# Patient Record
Sex: Female | Born: 1970 | Race: Black or African American | Hispanic: No | Marital: Married | Smoking: Never smoker
Health system: Southern US, Community
[De-identification: ages and names within clinical notes are randomized; demographics above are authoritative.]

## PROBLEM LIST (undated history)

## (undated) DIAGNOSIS — I10 Essential (primary) hypertension: Secondary | ICD-10-CM

## (undated) HISTORY — PX: TUBAL LIGATION: SHX77

---

## 2002-08-01 ENCOUNTER — Emergency Department (HOSPITAL_COMMUNITY): Admission: EM | Admit: 2002-08-01 | Discharge: 2002-08-02 | Payer: Self-pay | Admitting: Emergency Medicine

## 2002-08-05 ENCOUNTER — Ambulatory Visit (HOSPITAL_COMMUNITY): Admission: RE | Admit: 2002-08-05 | Discharge: 2002-08-05 | Payer: Self-pay | Admitting: Family Medicine

## 2003-01-22 ENCOUNTER — Emergency Department (HOSPITAL_COMMUNITY): Admission: EM | Admit: 2003-01-22 | Discharge: 2003-01-22 | Payer: Self-pay | Admitting: Emergency Medicine

## 2003-01-22 ENCOUNTER — Encounter: Payer: Self-pay | Admitting: Emergency Medicine

## 2003-02-07 ENCOUNTER — Emergency Department (HOSPITAL_COMMUNITY): Admission: EM | Admit: 2003-02-07 | Discharge: 2003-02-08 | Payer: Self-pay | Admitting: Emergency Medicine

## 2004-06-25 ENCOUNTER — Other Ambulatory Visit: Admission: RE | Admit: 2004-06-25 | Discharge: 2004-06-25 | Payer: Self-pay | Admitting: Family Medicine

## 2005-03-25 ENCOUNTER — Emergency Department (HOSPITAL_COMMUNITY): Admission: EM | Admit: 2005-03-25 | Discharge: 2005-03-25 | Payer: Self-pay | Admitting: *Deleted

## 2005-04-25 ENCOUNTER — Ambulatory Visit (HOSPITAL_COMMUNITY): Admission: RE | Admit: 2005-04-25 | Discharge: 2005-04-25 | Payer: Self-pay | Admitting: Family Medicine

## 2006-10-05 IMAGING — US US PELVIS COMPLETE MODIFY
1 series · 18 of 25 positions shown · non-contrast
Comparison: none

CLINICAL DATA: Pelvic pain.  Pelvic inflammatory disease.  IUD.  Evaluate for tuboovarian abscess.
TRANSABDOMINAL AND TRANSVAGINAL PELVIC ULTRASOUND:
TECHNIQUE: Both transabdominal and transvaginal ultrasound examinations of the pelvis were performed including evaluation of the uterus, ovaries, adnexal regions, and pelvic cul-de-sac.
The uterus is mildly enlarged measuring approximately 12.5 cm in length x 5.4 cm in AP diameter x 6.1 cm in transverse diameter on transabdominal sonography.  The uterus is normal in contour.  No adnexal masses or abnormal fluid collections are identified.
Transvaginal sonography shows an IUD in appropriate position within the endometrial cavity of the uterus.  No fluid is seen within the endometrial cavity.  The myometrium has heterogeneous echotexture and several small cystic areas are seen within the junctional zone of the myometrium, and this is suspicious for adenomyosis.  No fibroids are identified.  
Both ovaries are directly visualized on transvaginal sonography and are normal in appearance.  Tiny less than 1 cm follicles are seen in both.  There is a tiny amount of free fluid seen in the right adnexa which is within a physiologic range.

[Series 1: us transvaginal non-ob · 18 of 116 slices shown]
[im 1/116]
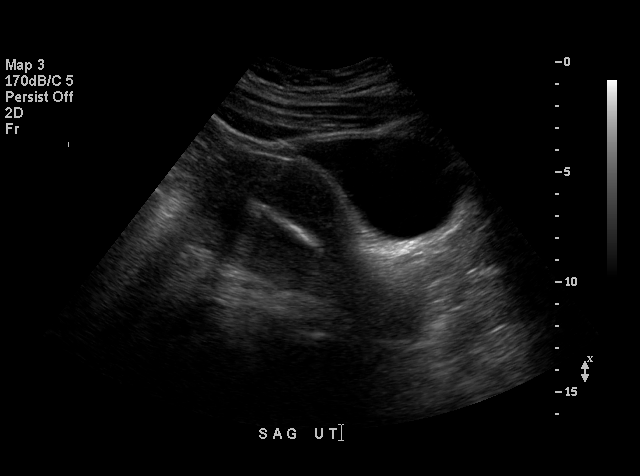
[im 10/116]
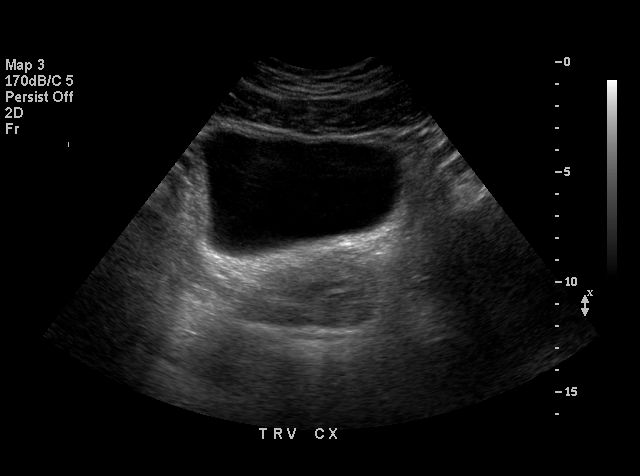
[im 15/116]
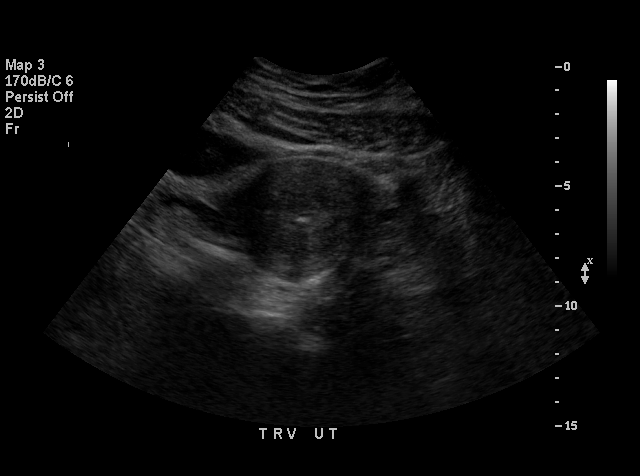
[im 20/116]
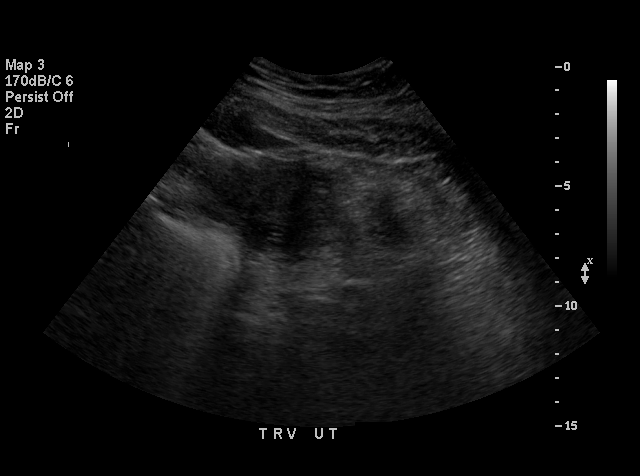
[im 29/116]
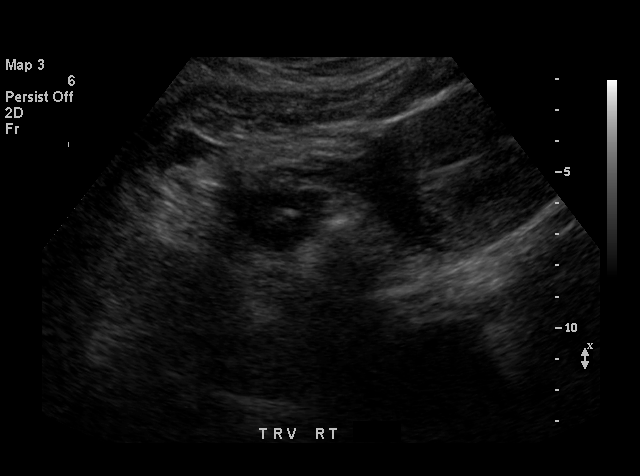
[im 34/116]
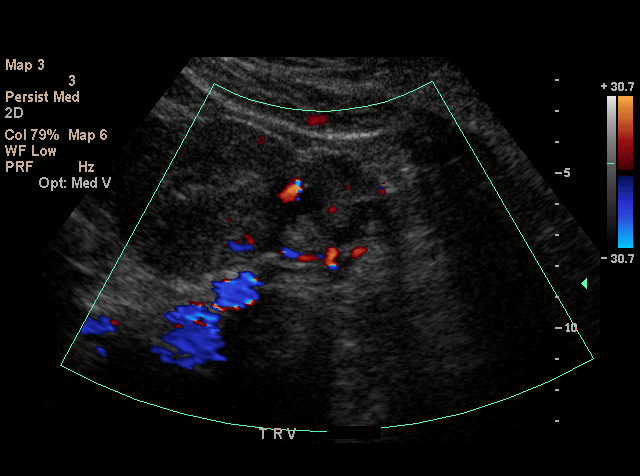
[im 44/116]
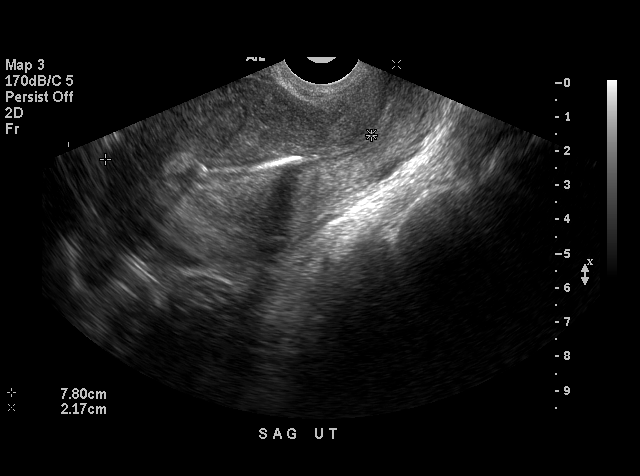
[im 48/116]
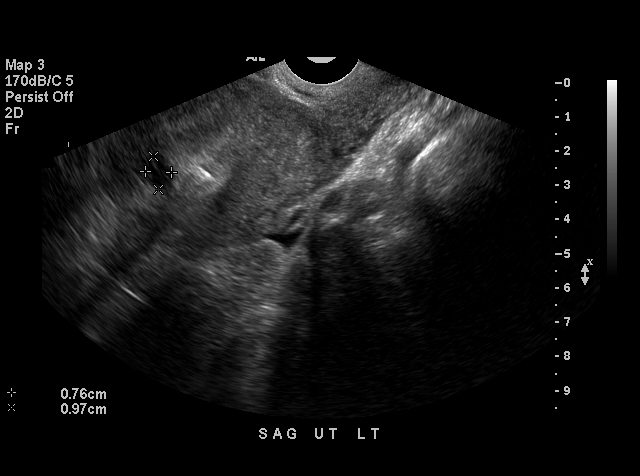
[im 53/116]
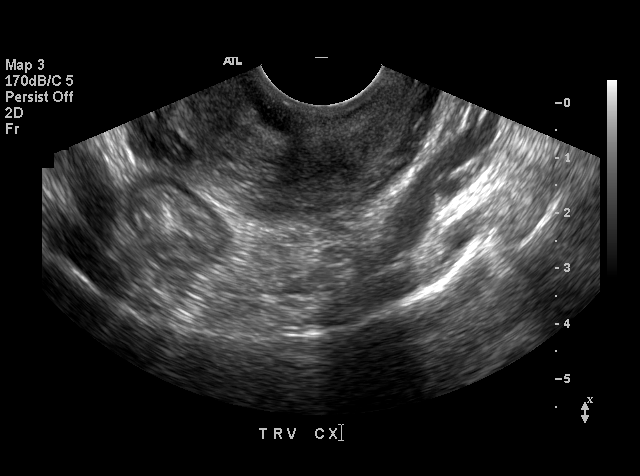
[im 63/116]
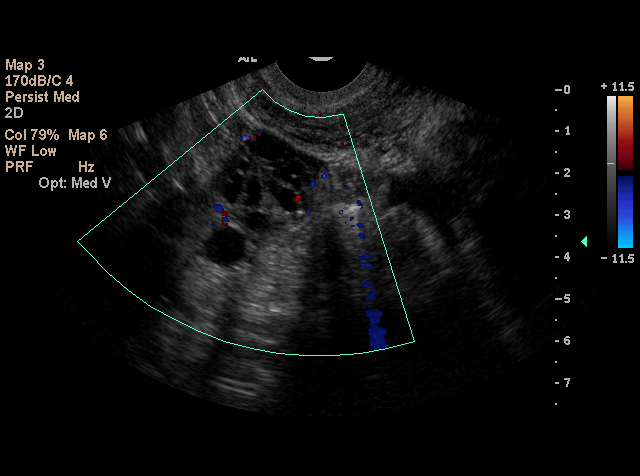
[im 68/116]
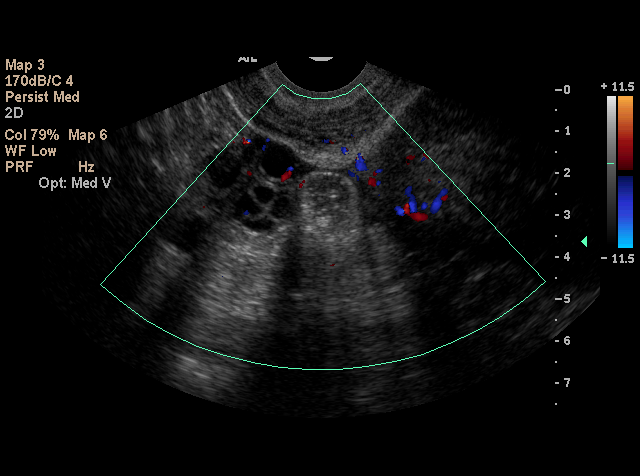
[im 72/116]
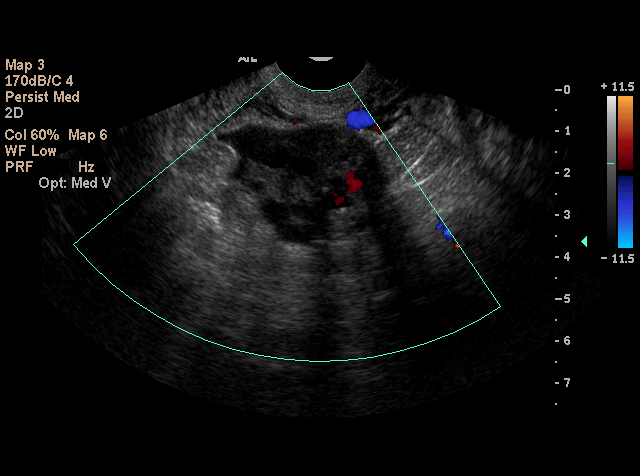
[im 82/116]
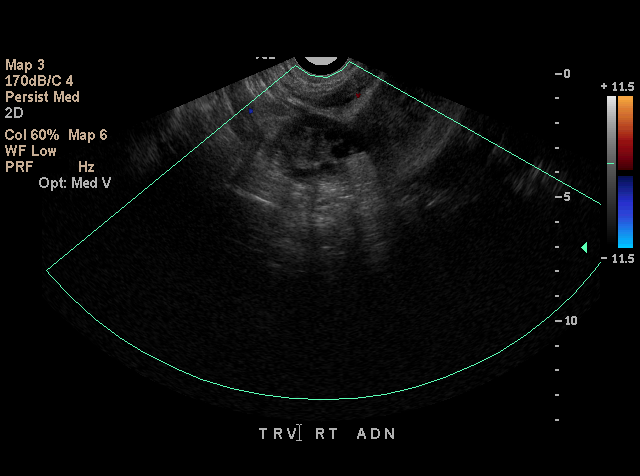
[im 87/116]
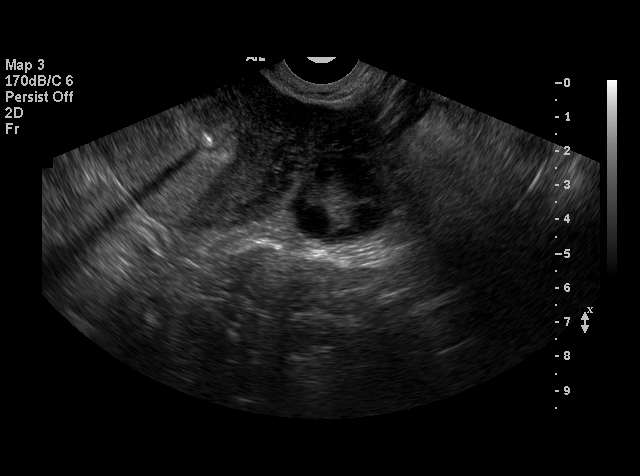
[im 96/116]
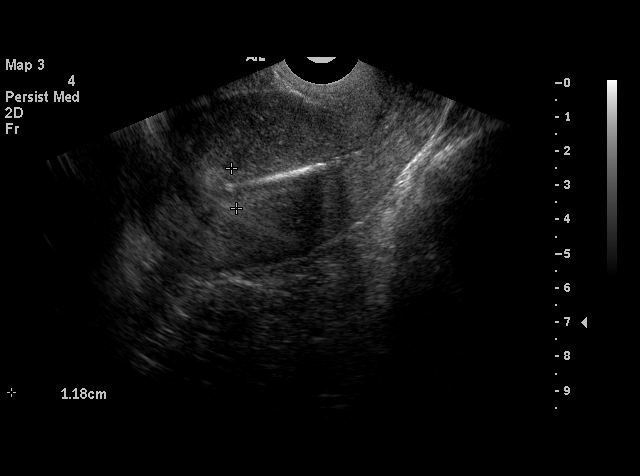
[im 101/116]
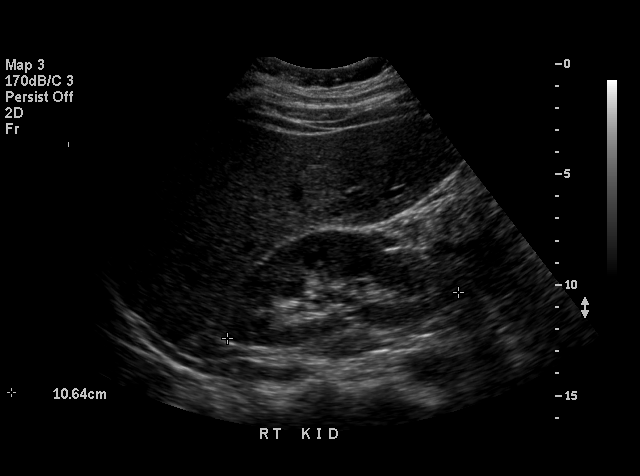
[im 106/116]
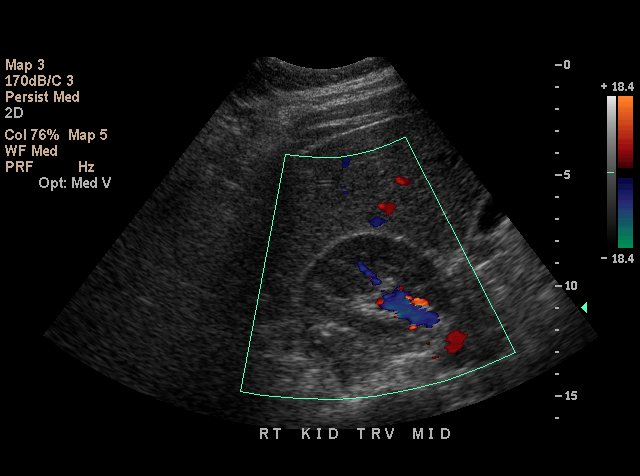
[im 116/116]
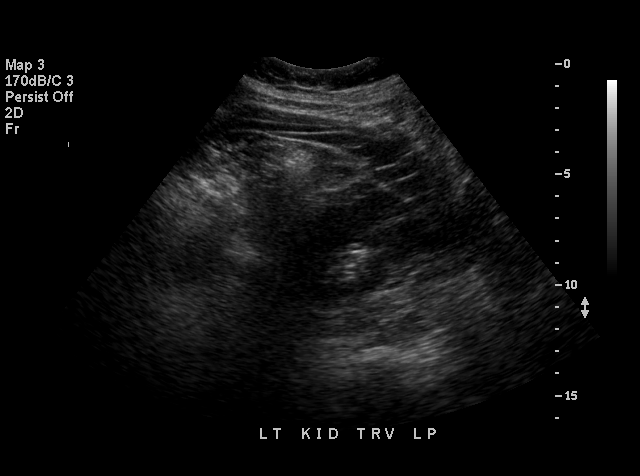

[18 of 25 positions shown; findings below may reference images not displayed]

IMPRESSION: 1.  Mildly enlarged uterus with tiny cystic areas in the junctional zone of the myometrium, suspicious for adenomyosis.  If clinically warranted, pelvis MRI could be performed for further evaluation.
2.  IUD within appropriate position within the endometrial cavity.
3.  Normal appearance of both ovaries.  No evidence of tuboovarian abscess or other adnexal mass.

## 2011-06-04 ENCOUNTER — Other Ambulatory Visit: Payer: Self-pay | Admitting: Occupational Medicine

## 2011-06-04 ENCOUNTER — Ambulatory Visit
Admission: RE | Admit: 2011-06-04 | Discharge: 2011-06-04 | Disposition: A | Discharge status: Home / Self Care | Payer: Self-pay | Source: Ambulatory Visit | Attending: Occupational Medicine | Admitting: Occupational Medicine

## 2011-06-04 DIAGNOSIS — Z021 Encounter for pre-employment examination: Secondary | ICD-10-CM

## 2012-11-13 IMAGING — CR DG CHEST 1V
1 series · 1 of 1 positions shown · non-contrast
Comparison: None.

CLINICAL DATA: Pre-employment, history of positive PPD

CHEST - 1 VIEW

[view not recorded]
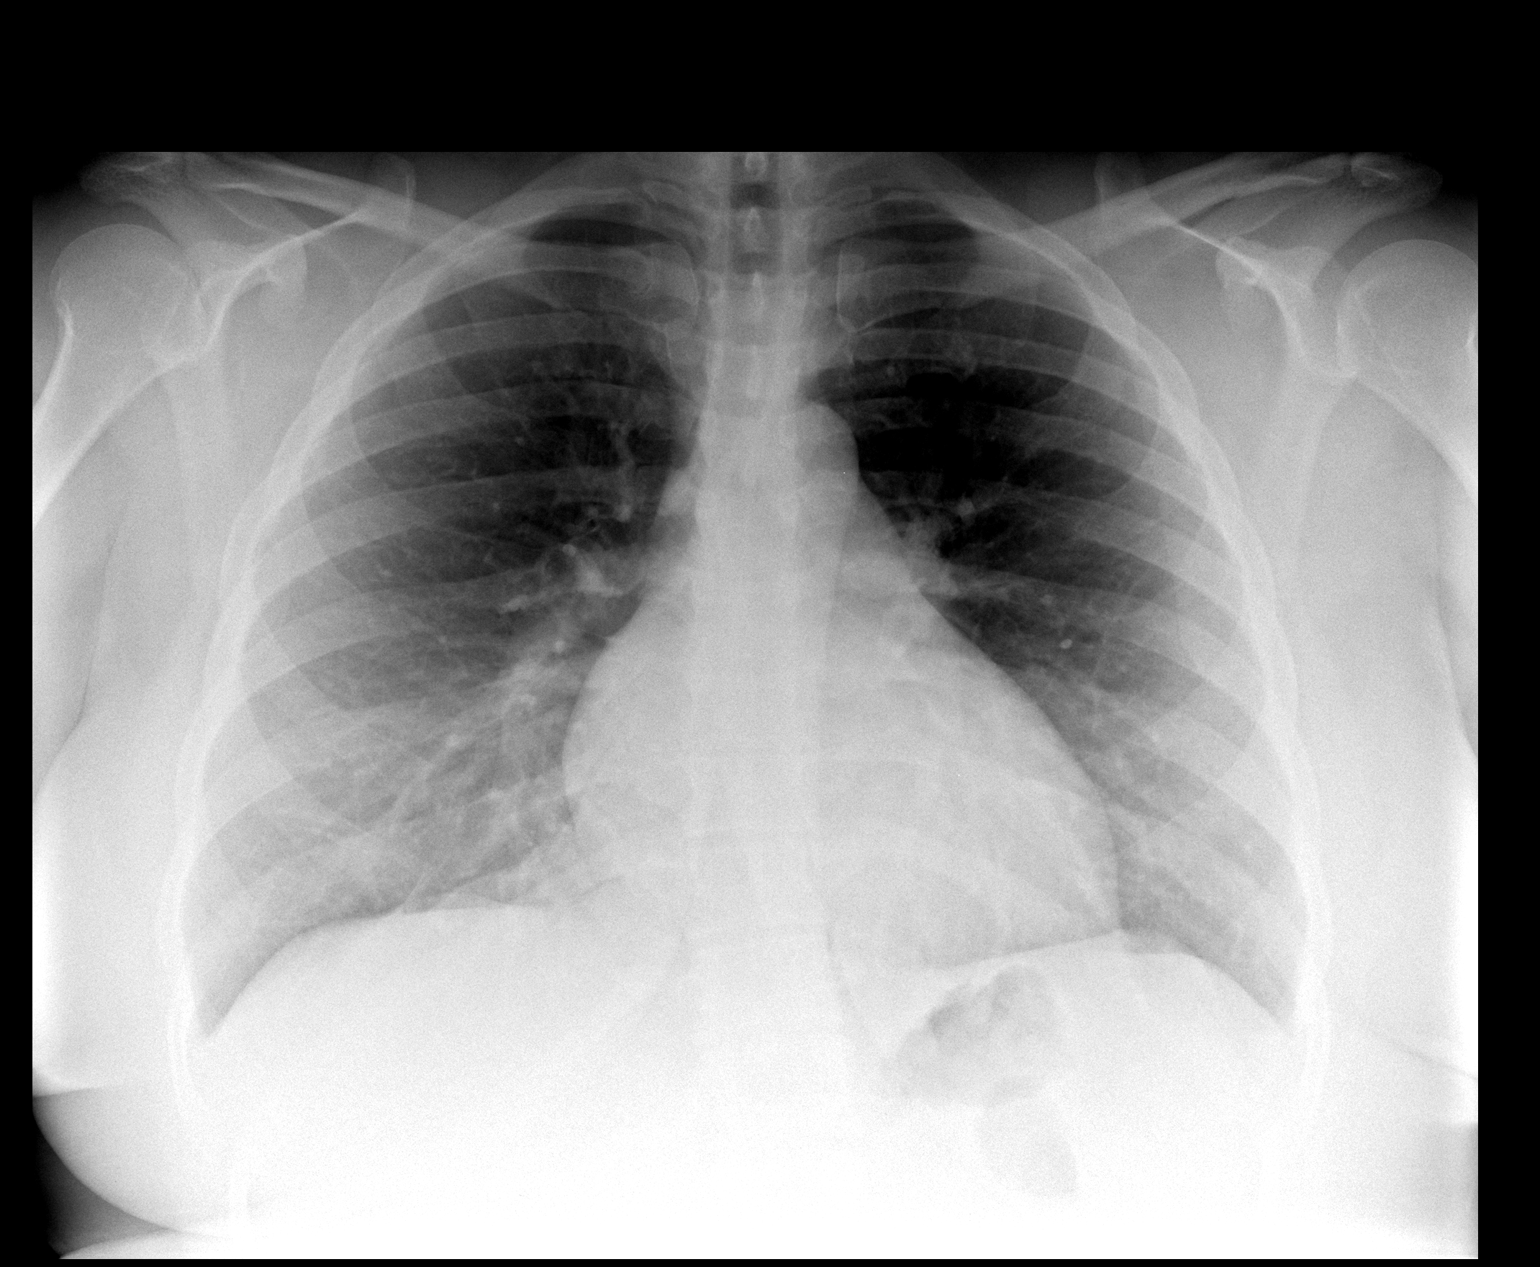

[1 of 1 positions shown; findings below may reference images not displayed]

FINDINGS: Lungs are clear. No pleural effusion or pneumothorax.

Cardiomediastinal silhouette is within normal limits.
IMPRESSION: No evidence of acute cardiopulmonary disease.

## 2013-05-16 ENCOUNTER — Telehealth: Payer: Self-pay | Admitting: *Deleted

## 2013-05-16 NOTE — Telephone Encounter (Signed)
Opened in error

## 2017-06-26 ENCOUNTER — Encounter: Payer: Self-pay | Admitting: Emergency Medicine

## 2017-06-26 ENCOUNTER — Emergency Department (INDEPENDENT_AMBULATORY_CARE_PROVIDER_SITE_OTHER)
Admission: EM | Admit: 2017-06-26 | Discharge: 2017-06-26 | Disposition: A | Discharge status: Home / Self Care | Payer: Managed Care, Other (non HMO) | Source: Home / Self Care | Attending: Family Medicine | Admitting: Family Medicine

## 2017-06-26 ENCOUNTER — Telehealth: Payer: Self-pay | Admitting: Emergency Medicine

## 2017-06-26 DIAGNOSIS — L03115 Cellulitis of right lower limb: Secondary | ICD-10-CM

## 2017-06-26 DIAGNOSIS — D508 Other iron deficiency anemias: Secondary | ICD-10-CM

## 2017-06-26 LAB — COMPLETE METABOLIC PANEL WITH GFR
AG Ratio: 1.2 (calc) (ref 1.0–2.5)
ALT: 13 U/L (ref 6–29)
AST: 18 U/L (ref 10–30)
Albumin: 4 g/dL (ref 3.6–5.1)
Alkaline phosphatase (APISO): 70 U/L (ref 33–115)
BUN: 9 mg/dL (ref 7–25)
CO2: 29 mmol/L (ref 20–32)
Calcium: 9.3 mg/dL (ref 8.6–10.2)
Chloride: 94 mmol/L — ABNORMAL LOW (ref 98–110)
Creat: 0.82 mg/dL (ref 0.50–1.10)
GFR, Est African American: 101 mL/min/{1.73_m2} (ref >=60)
GFR, Est Non African American: 87 mL/min/{1.73_m2} (ref >=60)
Globulin: 3.4 g/dL (calc) (ref 1.9–3.7)
Glucose, Bld: 105 mg/dL — ABNORMAL HIGH (ref 65–99)
Potassium: 3.2 mmol/L — ABNORMAL LOW (ref 3.5–5.3)
Sodium: 132 mmol/L — ABNORMAL LOW (ref 135–146)
Total Bilirubin: 0.8 mg/dL (ref 0.2–1.2)
Total Protein: 7.4 g/dL (ref 6.1–8.1)

## 2017-06-26 LAB — POCT CBC W AUTO DIFF (K'VILLE URGENT CARE)

## 2017-06-26 MED ORDER — IBUPROFEN 600 MG PO TABS: 600.0000 mg | ORAL_TABLET | Freq: Once | ORAL | Status: DC

## 2017-06-26 MED ORDER — ONDANSETRON HCL 4 MG PO TABS
4.0000 mg | ORAL_TABLET | Freq: Once | ORAL | Status: AC
  Administered 2017-06-26: 4 mg via ORAL

## 2017-06-26 MED ORDER — ONDANSETRON HCL 4 MG PO TABS: 4.0000 mg | ORAL_TABLET | Freq: Four times a day (QID) | ORAL | 0 refills | Status: DC

## 2017-06-26 MED ORDER — CLINDAMYCIN HCL 300 MG PO CAPS: 300.0000 mg | ORAL_CAPSULE | Freq: Three times a day (TID) | ORAL | 0 refills | Status: DC

## 2017-06-26 MED ORDER — CEFTRIAXONE SODIUM 1 G IJ SOLR
1.0000 g | Freq: Once | INTRAMUSCULAR | Status: AC
  Administered 2017-06-26: 1 g via INTRAMUSCULAR

## 2017-06-26 MED ORDER — ACETAMINOPHEN 325 MG PO TABS
650.0000 mg | ORAL_TABLET | Freq: Once | ORAL | Status: AC
  Administered 2017-06-26: 650 mg via ORAL

## 2017-06-26 NOTE — Discharge Instructions (Signed)
°  You may take  acetaminophen every 4-6 hours or in combination with ibuprofen 400-600mg  every 6-8 hours as needed for pain, inflammation, and fever.  Be sure to drink at least eight 8oz glasses of water to stay well hydrated and get at least 8 hours of sleep at night, preferably more while sick.   If you are not improving in 3-4 days, please be reevaluated, if symptoms worsening you may return to urgent care this weekend, however, if significantly worsening- fever not responding to over the counter medications, worsening pain, swelling, unable to keep down fluids, worsening dizziness or passing out, please call 911 or go to closest emergency department as you will possibly need IV antibiotics.    Your labs today indicate that you do have anemia.  You labs in the past have also shown signs of anemia, it is recommended that you follow up with your primary care provider next week for recheck of blood work, regardless of your leg improving or not.

## 2017-06-26 NOTE — ED Triage Notes (Signed)
Pt c/o HA and knot on her right leg last night. States this am knot became increasingly painful and she developed fever of 103, vomiting and dizziness. She has not taken any meds for this.

## 2017-06-26 NOTE — ED Triage Notes (Addendum)
Pt states she was seen last month in the ED for cellulitis of right leg. She completed abx prescribed. Pain is in the same area as last time.

## 2017-06-26 NOTE — ED Provider Notes (Signed)
[facility]    CSN: [id number] Arrival date & time: 06/26/17  1114     History   Chief Complaint Chief Complaint  Patient presents with  . Fever    HPI Holly Cooley is a 46 y.o. female.   HPI  Holly Cooley is a 46 y.o. female presenting to UC with c/o sudden onset Right lower leg swelling, redness, pain and fever that started this morning.  Pt reports going to the ED last month for cellulitis in the same leg. Symptoms did resolve completely after completing a course of Bactrim.  Yesterday she felt fine. Today she feels dizzy and fatigued. One episode of vomiting with continued nausea. She has not taken anything for symptoms PTA. She does not take ibuprofen because it raises her BP.  No hx of DM.  She did have an U/S of Right lower leg last month, negative for DVT. No hx of blood clots in the past.    History reviewed. No pertinent past medical history.  There are no active problems to display for this patient.   History reviewed. No pertinent surgical history.  OB History    No data available       Home Medications    Prior to Admission medications   Medication Sig Start Date End Date Taking? Authorizing Provider  amlodipine-atorvastatin (CADUET) 10-10 MG tablet Take 1 tablet by mouth daily.   Yes [provider]  lisinopril (PRINIVIL,ZESTRIL) 10 MG tablet Take 10 mg by mouth daily.   Yes [provider]  metoprolol (TOPROL-XL) 200 MG 24 hr tablet Take 200 mg by mouth daily.   Yes [provider]  clindamycin (CLEOCIN) 300 MG capsule Take 1 capsule (300 mg total) by mouth 3 (three) times daily. X 10 days 06/26/17   Lurene Shadow, PA-C  ondansetron (ZOFRAN) 4 MG tablet Take 1 tablet (4 mg total) by mouth every 6 (six) hours. 06/26/17   Lurene Shadow, PA-C    Family History History reviewed. No pertinent family history.  Social History Social History  Substance Use Topics  . Smoking status: Never Smoker  . Smokeless tobacco: Never  Used  . Alcohol use Not on file     Allergies   Patient has no allergy information on record.   Review of Systems Review of Systems  Constitutional: Positive for chills, fatigue and fever.  HENT: Negative for congestion, ear pain, sore throat, trouble swallowing and voice change.   Respiratory: Negative for cough and shortness of breath.   Cardiovascular: Negative for chest pain and palpitations.  Gastrointestinal: Positive for nausea and vomiting. Negative for abdominal pain and diarrhea.  Musculoskeletal: Positive for joint swelling and myalgias. Negative for arthralgias and back pain.  Skin: Positive for color change. Negative for rash and wound.  Neurological: Positive for dizziness and weakness (generalized). Negative for light-headedness and headaches.     Physical Exam Triage Vital Signs ED Triage Vitals  Enc Vitals Group     BP 06/26/17 1144 117/73     Pulse Rate 06/26/17 1144 (!) 120     Resp --      Temp 06/26/17 1144 (!) 103 F (39.4 C)     Temp Source 06/26/17 1144 Oral     SpO2 06/26/17 1144 98 %     Weight --      Height --      Head Circumference --      Peak Flow --      Pain Score 06/26/17 1145 10  Pain Loc --      Pain Edu? --      Excl. in GC? --    No data found.   Updated Vital Signs BP 117/69   Pulse (!) 106   Temp (!) 101.7 F (38.7 C) (Oral)   LMP 06/04/2017 (Approximate)   SpO2 95%   Visual Acuity Right Eye Distance:   Left Eye Distance:   Bilateral Distance:    Right Eye Near:   Left Eye Near:    Bilateral Near:     Physical Exam  Constitutional: She is oriented to person, place, and time. She appears well-developed and well-nourished. No distress.  Obese pt sitting in wheelchair, NAD  HENT:  Head: Normocephalic and atraumatic.  Eyes: EOM are normal.  Neck: Normal range of motion.  Cardiovascular: Normal rate.   Pulses:      Dorsalis pedis pulses are 2+ on the right side.       Posterior tibial pulses are 2+ on the  right side.  Pulmonary/Chest: Effort normal. No respiratory distress.  Musculoskeletal: Normal range of motion. She exhibits edema and tenderness.  Obese LE, no significant increased swelling in Right LE compared to Left. Mild tenderness to Right medial thigh and Right lower leg.   Neurological: She is alert and oriented to person, place, and time.  Skin: Skin is warm and dry. She is not diaphoretic. There is erythema.  Right lower leg: diffuse erythema, warmth, mild tenderness. Skin in tact.   Psychiatric: She has a normal mood and affect. Her behavior is normal.  Nursing note and vitals reviewed.    UC Treatments / Results  Labs (all labs ordered are listed, but only abnormal results are displayed) Labs Reviewed  COMPLETE METABOLIC PANEL WITH GFR - Abnormal; Notable for the following:       Result Value   Glucose, Bld 105 (*)    Sodium 132 (*)    Potassium 3.2 (*)    Chloride 94 (*)    All other components within normal limits  COMPLETE METABOLIC PANEL WITH GFR  POCT CBC W AUTO DIFF ([facility])    EKG  EKG Interpretation None       Radiology No results found.  Procedures Procedures (including critical care time)  Medications Ordered in UC Medications  acetaminophen (TYLENOL) tablet 650 mg (650 mg Oral Given 06/26/17 1152)  ondansetron (ZOFRAN) tablet 4 mg (4 mg Oral Given 06/26/17 1152)  cefTRIAXone (ROCEPHIN) injection 1 g (1 g Intramuscular Given 06/26/17 1238)     Initial Impression / Assessment and Plan / UC Course  I have reviewed the triage vital signs and the nursing notes.  Pertinent labs & imaging results that were available during my care of the patient were reviewed by me and considered in my medical decision making (see chart for details).     Pt c/o sudden onset repeat symptoms in Right lower leg with redness, swelling, pain and fever that she was tx for last month for cellulitis with bactrim. Pt had Negative ultrasound for DVT at that  time.   CBC: elevated WBC- 16 (as expected with cellulitis), Hgb- 9.6  (hx of anemia, has been down to 8 in the past) pt does not take any iron supplements.  Pt states "I probably should be but I don't.")  Will treat for recurrent cellulitis.  Rocephin 1g IM given in UC Will have pt start on Clindamycin  Encouraged close f/u. Advised to call 911 or go to closest ED if  symptoms worsening this evening or this weekend.    Final Clinical Impressions(s) / UC Diagnoses   Final diagnoses:  Cellulitis of right lower leg  Other iron deficiency anemia    New Prescriptions Discharge Medication List as of 06/26/2017 12:55 PM    START taking these medications   Details  clindamycin (CLEOCIN) 300 MG capsule Take 1 capsule (300 mg total) by mouth 3 (three) times daily. X 10 days, Starting Fri 06/26/2017, Normal    ondansetron (ZOFRAN) 4 MG tablet Take 1 tablet (4 mg total) by mouth every 6 (six) hours., Starting Fri 06/26/2017, Normal         Controlled Substance Prescriptions [place] Controlled Substance Registry consulted? Not Applicable   Rolla Plate 06/26/17 1610

## 2017-06-27 NOTE — Telephone Encounter (Signed)
Patient states that she is doing much better.  She no longer has a fever, her appetite has picked up, her leg is somewhat still warm.  Patient advised that her labs were normal and will follow up with PCP as directed.

## 2017-10-31 ENCOUNTER — Emergency Department (INDEPENDENT_AMBULATORY_CARE_PROVIDER_SITE_OTHER)
Admission: EM | Admit: 2017-10-31 | Discharge: 2017-10-31 | Disposition: A | Discharge status: Home / Self Care | Payer: Managed Care, Other (non HMO) | Source: Home / Self Care | Attending: Family Medicine | Admitting: Family Medicine

## 2017-10-31 ENCOUNTER — Other Ambulatory Visit: Payer: Self-pay

## 2017-10-31 ENCOUNTER — Encounter: Payer: Self-pay | Admitting: *Deleted

## 2017-10-31 DIAGNOSIS — R112 Nausea with vomiting, unspecified: Secondary | ICD-10-CM | POA: Diagnosis not present

## 2017-10-31 DIAGNOSIS — R1032 Left lower quadrant pain: Secondary | ICD-10-CM

## 2017-10-31 DIAGNOSIS — R509 Fever, unspecified: Secondary | ICD-10-CM | POA: Diagnosis not present

## 2017-10-31 DIAGNOSIS — M79662 Pain in left lower leg: Secondary | ICD-10-CM

## 2017-10-31 HISTORY — DX: Essential (primary) hypertension: I10

## 2017-10-31 MED ORDER — PROMETHAZINE HCL 25 MG/ML IJ SOLN
25.0000 mg | Freq: Once | INTRAMUSCULAR | Status: AC
  Administered 2017-10-31: 25 mg via INTRAMUSCULAR

## 2017-10-31 MED ORDER — ONDANSETRON 4 MG PO TBDP
4.0000 mg | ORAL_TABLET | Freq: Once | ORAL | Status: AC
  Administered 2017-10-31: 4 mg via ORAL

## 2017-10-31 MED ORDER — ACETAMINOPHEN 325 MG PO TABS
975.0000 mg | ORAL_TABLET | Freq: Four times a day (QID) | ORAL | Status: DC | PRN
  Administered 2017-10-31: 975 mg via ORAL

## 2017-10-31 NOTE — ED Notes (Signed)
EMS arrived report given by Erin Phelps,PA. Allie Ousley, LPN  

## 2017-10-31 NOTE — ED Triage Notes (Signed)
Pt reports that she awoke with LT groin pain today. She now c/o fever up to 103.3, nausea and LT calf pain. No OTC meds.

## 2017-10-31 NOTE — ED Provider Notes (Signed)
[facility]    CSN: [id number] Arrival date & time: 10/31/17  1250     History   Chief Complaint Chief Complaint  Patient presents with  . Groin Pain  . Fever    HPI Holly Cooley is a 47 y.o. female.   HPI Holly Cooley is a 47 y.o. female presenting to UC with c/o sudden onset, worsening Left groin pain that started this morning.  She felt fine last night.  Today she went to work and developed nausea, fever Tmax 103.3*F and Left calf pain so she left work to come be evaluated.  No medication taken PTA.  Denies cough or congestion.  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  Denies chest pain or SOB.  She does have hx of cellulitis but typically that is in her Right leg.  Denies urinary symptoms, vomiting or diarrhea PTA.    Past Medical History:  Diagnosis Date  . Hypertension     There are no active problems to display for this patient.   Past Surgical History:  Procedure Laterality Date  . CESAREAN SECTION    . TUBAL LIGATION      OB History    No data available       Home Medications    Prior to Admission medications   Medication Sig Start Date End Date Taking? Authorizing Provider  amlodipine-atorvastatin (CADUET) 10-10 MG tablet Take 1 tablet by mouth daily.   Yes [provider]  lisinopril (PRINIVIL,ZESTRIL) 10 MG tablet Take 10 mg by mouth daily.   Yes [provider]  metoprolol (TOPROL-XL) 200 MG 24 hr tablet Take 200 mg by mouth daily.   Yes [provider]    Family History Family History  Adopted: Yes  Problem Relation Age of Onset  . Hypertension Mother   . Hypertension Father     Social History Social History   Tobacco Use  . Smoking status: Never Smoker  . Smokeless tobacco: Never Used  Substance Use Topics  . Alcohol use: No    Frequency: Never  . Drug use: No     Allergies   Patient has no known allergies.   Review of Systems Review of Systems    Constitutional: Positive for fatigue and fever. Negative for chills.  HENT: Negative for congestion, ear pain, sore throat, trouble swallowing and voice change.   Respiratory: Negative for cough and shortness of breath.   Cardiovascular: Negative for chest pain and palpitations.  Gastrointestinal: Positive for abdominal pain (Left groin) and nausea. Negative for diarrhea and vomiting.  Genitourinary: Positive for pelvic pain (Left groin). Negative for dysuria, flank pain, frequency, hematuria and urgency.  Musculoskeletal: Positive for myalgias (Left calf). Negative for arthralgias and back pain.  Skin: Negative for color change, rash and wound.     Physical Exam Triage Vital Signs ED Triage Vitals  Enc Vitals Group     BP 10/31/17 1315 137/75     Pulse Rate 10/31/17 1315 (!) 127     Resp 10/31/17 1315 18     Temp 10/31/17 1315 (!) 102.7 F (39.3 C)     Temp Source 10/31/17 1315 Oral     SpO2 10/31/17 1315 97 %     Weight 10/31/17 1316 259 lb (117.5 kg)     Height 10/31/17 1316 5\' 1"  (1.549 m)     Head Circumference --      Peak Flow --      Pain Score 10/31/17  1315 9     Pain Loc --      Pain Edu? --      Excl. in GC? --    No data found.  Updated Vital Signs BP 116/69 (BP Location: Left Arm)   Pulse (!) 138   Temp (!) 104.4 F (40.2 C) (Tympanic)   Resp 18   Ht 5\' 1"  (1.549 m)   Wt 259 lb (117.5 kg)   LMP 10/24/2017   SpO2 97%   BMI 48.94 kg/m   Visual Acuity Right Eye Distance:   Left Eye Distance:   Bilateral Distance:    Right Eye Near:   Left Eye Near:    Bilateral Near:     Physical Exam  Constitutional: She is oriented to person, place, and time. She appears well-developed and well-nourished.  Pt lying in exam room with nurse by her side as pt vomiting.  Pt alert and cooperative lying on her Left side.  HENT:  Head: Normocephalic and atraumatic.  Right Ear: Tympanic membrane normal.  Left Ear: Tympanic membrane normal.  Nose: Nose normal.   Mouth/Throat: Uvula is midline, oropharynx is clear and moist and mucous membranes are normal.  Eyes: EOM are normal.  Neck: Normal range of motion. Neck supple.  Cardiovascular: Regular rhythm. Tachycardia present.  Pulmonary/Chest: Effort normal and breath sounds normal. No stridor. No respiratory distress. She has no wheezes. She has no rales.  Abdominal: Soft. She exhibits no mass. There is no tenderness. There is no guarding.  Obese abdomen, soft, non-tender.   Genitourinary:  Genitourinary Comments: Left groin: tenderness to lymph nodes in groin. No erythema or warmth suggestive of abscess  Musculoskeletal: Normal range of motion.  Left calf: no obvious edema compared to Right. Diffuse tenderness.   Neurological: She is alert and oriented to person, place, and time.  Skin: Skin is warm. Capillary refill takes less than 2 seconds. She is diaphoretic. There is erythema.  Left calf: skin in tact. Faint erythema. Warmth.   Psychiatric: She has a normal mood and affect. Her behavior is normal.  Nursing note and vitals reviewed.    UC Treatments / Results  Labs (all labs ordered are listed, but only abnormal results are displayed) Labs Reviewed - No data to display  EKG  EKG Interpretation None       Radiology No results found.  Procedures Procedures (including critical care time)  Medications Ordered in UC Medications  acetaminophen (TYLENOL) tablet 975 mg (975 mg Oral Given 10/31/17 1350)  ondansetron (ZOFRAN-ODT) disintegrating tablet 4 mg (4 mg Oral Given 10/31/17 1318)  promethazine (PHENERGAN) injection 25 mg (25 mg Intramuscular Given 10/31/17 1344)     Initial Impression / Assessment and Plan / UC Course  I have reviewed the triage vital signs and the nursing notes.  Pertinent labs & imaging results that were available during my care of the patient were reviewed by me and considered in my medical decision making (see chart for details).     Fever increased  from 102*F to 104*F within of arrival to UC. Pt has vomited 4-5 times despite receiving Zofran upon arrival  Phenergan and acetaminophen given in UC Recommend pt be transferred to ED for further evaluation and treatment Pt agreeable. EMS called to take pt to [facility] per pt requested facility (her PCPs are there)  Pt discharged into care of EMS in stable condition.   Final Clinical Impressions(s) / UC Diagnoses   Final diagnoses:  Febrile illness  Nausea  and vomiting in adult  Left groin pain  Pain of left calf    ED Discharge Orders    None       Controlled Substance Prescriptions [place] Controlled Substance Registry consulted? Not Applicable   Rolla Platehelps, Aissa Lisowski O, PA-C 10/31/17 1419

## 2017-11-01 ENCOUNTER — Telehealth: Payer: Self-pay | Admitting: Emergency Medicine

## 2017-11-01 NOTE — Telephone Encounter (Signed)
Spoke with patient states that she is doing ok, she was admitted to the hospital yesterday.  They believe it's "cellulitis" but she is still running a fever.

## 2017-11-02 MED ORDER — INFLUENZA VAC SPLIT QUAD 0.5 ML IM SUSY
0.50 | PREFILLED_SYRINGE | INTRAMUSCULAR | Status: DC
Start: ? — End: 2017-11-02

## 2017-11-02 MED ORDER — POTASSIUM CHLORIDE ER 10 MEQ PO CPCR
ORAL_CAPSULE | ORAL | Status: DC
Start: 2017-11-02 — End: 2017-11-02

## 2017-11-02 MED ORDER — ACETAMINOPHEN 325 MG PO TABS
650.00 | ORAL_TABLET | ORAL | Status: DC
Start: ? — End: 2017-11-02

## 2017-11-02 MED ORDER — ONDANSETRON 4 MG PO TBDP
4.00 | ORAL_TABLET | ORAL | Status: DC
Start: ? — End: 2017-11-02

## 2017-11-02 MED ORDER — GENERIC EXTERNAL MEDICATION
1.75 g | Status: DC
Start: 2017-11-02 — End: 2017-11-02

## 2017-11-02 MED ORDER — METOPROLOL SUCCINATE ER 50 MG PO TB24
200.00 | ORAL_TABLET | ORAL | Status: DC
Start: 2017-11-03 — End: 2017-11-02

## 2017-11-02 MED ORDER — PIPERACILLIN SOD-TAZOBACTAM SO 4.5 (4-0.5) G IV SOLR
4.50 g | INTRAVENOUS | Status: DC
Start: 2017-11-02 — End: 2017-11-02

## 2017-11-02 MED ORDER — ENOXAPARIN SODIUM 40 MG/0.4ML ~~LOC~~ SOLN
40.00 | SUBCUTANEOUS | Status: DC
Start: 2017-11-02 — End: 2017-11-02

## 2017-11-02 MED ORDER — AMLODIPINE BESYLATE 5 MG PO TABS
10.00 | ORAL_TABLET | ORAL | Status: DC
Start: 2017-11-03 — End: 2017-11-02

## 2017-11-02 MED ORDER — GENERIC EXTERNAL MEDICATION
Status: DC
Start: 2017-11-03 — End: 2017-11-02

## 2018-05-18 ENCOUNTER — Encounter: Payer: Self-pay | Admitting: *Deleted

## 2018-05-18 ENCOUNTER — Other Ambulatory Visit: Payer: Self-pay

## 2018-05-18 ENCOUNTER — Emergency Department (INDEPENDENT_AMBULATORY_CARE_PROVIDER_SITE_OTHER)
Admission: EM | Admit: 2018-05-18 | Discharge: 2018-05-18 | Disposition: A | Discharge status: Home / Self Care | Payer: Managed Care, Other (non HMO) | Source: Home / Self Care | Attending: Family Medicine | Admitting: Family Medicine

## 2018-05-18 DIAGNOSIS — L03115 Cellulitis of right lower limb: Secondary | ICD-10-CM | POA: Diagnosis not present

## 2018-05-18 MED ORDER — FLUCONAZOLE 150 MG PO TABS: ORAL_TABLET | ORAL | 0 refills | Status: DC

## 2018-05-18 MED ORDER — CEFTRIAXONE SODIUM 1 G IJ SOLR
1000.0000 mg | Freq: Once | INTRAMUSCULAR | Status: AC
  Administered 2018-05-18: 1000 mg via INTRAMUSCULAR

## 2018-05-18 MED ORDER — ONDANSETRON 4 MG PO TBDP: ORAL_TABLET | ORAL | 0 refills | Status: AC

## 2018-05-18 MED ORDER — ACETAMINOPHEN 325 MG PO TABS
650.0000 mg | ORAL_TABLET | Freq: Once | ORAL | Status: AC
  Administered 2018-05-18: 650 mg via ORAL

## 2018-05-18 MED ORDER — CEPHALEXIN 500 MG PO CAPS: ORAL_CAPSULE | ORAL | 0 refills | Status: DC

## 2018-05-18 NOTE — ED Triage Notes (Signed)
Patient c/o sudden onset chills, fever, HA and a lump in her right thigh this evening. She reports a frequent history of cellulitis in her legs that typically begin with swelling, fever, chills and HA. No otc meds taken.

## 2018-05-18 NOTE — Discharge Instructions (Signed)
Elevate leg.  Increase fluid intake. If symptoms become significantly worse during the night or over the weekend, proceed to the local emergency room.

## 2018-05-18 NOTE — ED Provider Notes (Signed)
[facility]    CSN: [id number] Arrival date & time: 05/18/18  1921     History   Chief Complaint Chief Complaint  Patient presents with  . Chills  . Fever  . Leg Pain    HPI Holly Cooley is a 47 y.o. female.   Patient developed sudden onset this evening of chills, fever, headache, and swelling/pain in her right lower leg above the ankle.  She has also had some localized swelling in her right anterior/medial thigh.  She has had multiple episodes of cellulitis in her lower legs, and she believes that she is developing early cellulitis symptoms.  She has had negative venous ultrasounds of her lower legs when similarly swollen.  She had cellulitis of her leg in April 2019 which did not respond to clindamycin, but improved with Rocephin and Keflex.  She denies chest pain or shortness of breath   The history is provided by the patient.    Past Medical History:  Diagnosis Date  . Hypertension     There are no active problems to display for this patient.   Past Surgical History:  Procedure Laterality Date  . CESAREAN SECTION    . TUBAL LIGATION      OB History   None      Home Medications    Prior to Admission medications   Medication Sig Start Date End Date Taking? Authorizing Provider  amlodipine-atorvastatin (CADUET) 10-10 MG tablet Take 1 tablet by mouth daily.    [provider]  cephALEXin (KEFLEX) 500 MG capsule Take one cap by mouth Q6hr 05/18/18   Lattie HawBeese, Joann Jorge A, MD  fluconazole (DIFLUCAN) 150 MG tablet Take one cap by mouth for vaginal yeast infection.  May repeat in 72 hours. 05/18/18   Lattie HawBeese, Jontae Sonier A, MD  lisinopril (PRINIVIL,ZESTRIL) 10 MG tablet Take 10 mg by mouth daily.    [provider]  metoprolol (TOPROL-XL) 200 MG 24 hr tablet Take 200 mg by mouth daily.    [provider]  ondansetron (ZOFRAN ODT) 4 MG disintegrating tablet Take one tab by mouth Q6hr prn nausea.  Dissolve under tongue. 05/18/18   Lattie HawBeese, Mahika Vanvoorhis  A, MD    Family History Family History  Adopted: Yes  Problem Relation Age of Onset  . Hypertension Mother   . Hypertension Father     Social History Social History   Tobacco Use  . Smoking status: Never Smoker  . Smokeless tobacco: Never Used  Substance Use Topics  . Alcohol use: No    Frequency: Never  . Drug use: No     Allergies   Patient has no known allergies.   Review of Systems Review of Systems  Constitutional: Positive for activity change, chills, diaphoresis, fatigue and fever.  HENT: Negative.   Eyes: Negative.   Respiratory: Negative for cough, chest tightness, shortness of breath and stridor.   Cardiovascular: Positive for leg swelling. Negative for chest pain and palpitations.  Gastrointestinal: Negative.   Genitourinary: Negative.   Musculoskeletal: Negative for arthralgias and joint swelling.  Skin: Positive for color change.  Neurological: Negative.      Physical Exam Triage Vital Signs ED Triage Vitals [05/18/18 2000]  Enc Vitals Group     BP (!) 152/95     Pulse Rate (!) 124     Resp (!) 22     Temp 100.3 F (37.9 C)     Temp Source Oral     SpO2 100 %     Weight 248  lb (112.5 kg)     Height      Head Circumference      Peak Flow      Pain Score 8     Pain Loc      Pain Edu?      Excl. in [place]?    No data found.  Updated Vital Signs BP (!) 152/95 (BP Location: Right Arm)   Pulse (!) 124   Temp 100.3 F (37.9 C) (Oral)   Resp (!) 22   Wt 112.5 kg   LMP 05/18/2018   SpO2 100%   BMI 46.86 kg/m   Visual Acuity Right Eye Distance:   Left Eye Distance:   Bilateral Distance:    Right Eye Near:   Left Eye Near:    Bilateral Near:     Physical Exam  Constitutional: She appears well-developed and well-nourished. No distress.  HENT:  Head: Normocephalic.  Right Ear: External ear normal.  Left Ear: External ear normal.  Nose: Nose normal.  Mouth/Throat: Oropharynx is clear and moist.  Eyes: Pupils are equal, round,  and reactive to light.  Neck: Neck supple.  Cardiovascular: Regular rhythm and normal heart sounds.  Sinus tachycardia   Pulmonary/Chest: Breath sounds normal.  Abdominal: Soft. There is no tenderness.  Musculoskeletal: She exhibits edema and tenderness.       Right lower leg: She exhibits tenderness, swelling and edema. She exhibits no bony tenderness.       Legs: Right lower leg is erythematous, warm, and tender to palpation.  Right medial/anterior thigh also has area of tenderness to palpation as noted on diagram.  Pedal pulses intact.  Lymphadenopathy:    She has no cervical adenopathy.  Neurological: She is alert.  Skin: Skin is warm and dry.  Nursing note and vitals reviewed.    UC Treatments / Results  Labs (all labs ordered are listed, but only abnormal results are displayed) Labs Reviewed - No data to display  EKG None  Radiology No results found.  Procedures Procedures (including critical care time)  Medications Ordered in UC Medications  cefTRIAXone (ROCEPHIN) injection 1,000 mg (has no administration in time range)  acetaminophen (TYLENOL) tablet 650 mg (650 mg Oral Given 05/18/18 2003)    Initial Impression / Assessment and Plan / UC Course  I have reviewed the triage vital signs and the nursing notes.  Pertinent labs & imaging results that were available during my care of the patient were reviewed by me and considered in my medical decision making (see chart for details).    Patient has had numerous previous episodes of lower leg cellulitis that has not responded to clindamycin.  Previous venous ultrasound exams have been negative for DVT. Administered Rocephin 1gm IM.  Begin Keflex 500mg  Q6hr. Rx for Zofran for nausea if needed, and Rx for Diflucan. Recommend follow-up by PCP tomorrow.   Final Clinical Impressions(s) / UC Diagnoses   Final diagnoses:  Cellulitis of right lower leg     Discharge Instructions     Elevate leg.  Increase fluid  intake. If symptoms become significantly worse during the night or over the weekend, proceed to the local emergency room.     ED Prescriptions    Medication Sig Dispense Auth. Provider   cephALEXin (KEFLEX) 500 MG capsule Take one cap by mouth Q6hr 40 capsule Lattie Haw, MD   ondansetron (ZOFRAN ODT) 4 MG disintegrating tablet Take one tab by mouth Q6hr prn nausea.  Dissolve under tongue. 12 tablet Donna Christen  A, MD   fluconazole (DIFLUCAN) 150 MG tablet Take one cap by mouth for vaginal yeast infection.  May repeat in 72 hours. 2 tablet Lattie HawBeese, Kyrel Leighton A, MD         Lattie HawBeese, Katryn Plummer A, MD 05/21/18 1026

## 2018-05-20 ENCOUNTER — Telehealth: Payer: Self-pay

## 2018-05-20 NOTE — Telephone Encounter (Signed)
Pt is doing much better.  Will follow up as needed.

## 2022-07-14 ENCOUNTER — Ambulatory Visit
Admission: EM | Admit: 2022-07-14 | Discharge: 2022-07-14 | Disposition: A | Discharge status: Home / Self Care | Payer: BC Managed Care – PPO

## 2022-07-14 ENCOUNTER — Encounter: Payer: Self-pay | Admitting: Emergency Medicine

## 2022-07-14 ENCOUNTER — Ambulatory Visit (INDEPENDENT_AMBULATORY_CARE_PROVIDER_SITE_OTHER): Payer: BC Managed Care – PPO

## 2022-07-14 DIAGNOSIS — M7989 Other specified soft tissue disorders: Secondary | ICD-10-CM | POA: Diagnosis not present

## 2022-07-14 DIAGNOSIS — L03116 Cellulitis of left lower limb: Secondary | ICD-10-CM | POA: Diagnosis not present

## 2022-07-14 MED ORDER — CEPHALEXIN 500 MG PO CAPS: 500.0000 mg | ORAL_CAPSULE | Freq: Four times a day (QID) | ORAL | 0 refills | Status: AC

## 2022-07-14 NOTE — Discharge Instructions (Addendum)
Your Doppler ultrasound is negative for DVT. Please start taking Keflex 4 times daily until gone. Continue wearing your compression hose, elevate your legs above the level of your heart when seated. Please schedule follow-up in 1 week with your primary care physician to ensure the infection is completely resolved. Also discussed the possible use of Lasix to help with your chronic edema.  If you develop a fever, nausea vomiting, abdominal discomfort, or lethargy, please had to the emergency room.

## 2022-07-14 NOTE — ED Provider Notes (Signed)
[facility]    CSN: [id number] Arrival date & time: 07/14/22  1241      History   Chief Complaint Chief Complaint  Patient presents with   Leg Swelling    HPI Holly Cooley is a 51 y.o. female.   Pleasant 51 year old female with a known history of chronic edema presents today due to a 2-day onset of worsening left lower extremity pain and edema.  Patient does not smoke.  No prior history of DVT.  Is not on birth control.  Denies recent extensive car ride or trauma to her leg.  Does have a history of cellulitis in the left leg several times in the past.  States elevation has not resolved the swelling.  Feels that it is warm and tender.  Pressure in her calf causes extreme pain.  No swelling above the knee.  Patient has been wearing her compression hose as directed.  She denies a history of CHF, denies orthopnea, PND.  Able to lay flat at night.     Past Medical History:  Diagnosis Date   Hypertension     There are no problems to display for this patient.   Past Surgical History:  Procedure Laterality Date   CESAREAN SECTION     TUBAL LIGATION      OB History   No obstetric history on file.      Home Medications    Prior to Admission medications   Medication Sig Start Date End Date Taking? Authorizing Provider  cephALEXin (KEFLEX) 500 MG capsule Take 1 capsule (500 mg total) by mouth 4 (four) times daily for 7 days. 07/14/22 07/21/22 Yes Keerthi Hazell L, PA  amlodipine-atorvastatin (CADUET) 10-10 MG tablet Take 1 tablet by mouth daily.    [provider]  hydrochlorothiazide (HYDRODIURIL) 25 MG tablet Take 25 mg by mouth daily. 05/10/22   [provider]  lisinopril (PRINIVIL,ZESTRIL) 10 MG tablet Take 10 mg by mouth daily.    [provider]  metoprolol (TOPROL-XL) 200 MG 24 hr tablet Take 200 mg by mouth daily.    [provider]  ondansetron (ZOFRAN ODT) 4 MG disintegrating tablet Take one tab by mouth Q6hr prn nausea.   Dissolve under tongue. 05/18/18   Lattie Haw, MD    Family History Family History  Adopted: Yes  Problem Relation Age of Onset   Hypertension Mother    Hypertension Father     Social History Social History   Tobacco Use   Smoking status: Never   Smokeless tobacco: Never  Vaping Use   Vaping Use: Never used  Substance Use Topics   Alcohol use: No   Drug use: No     Allergies   Patient has no known allergies.   Review of Systems Review of Systems As per HPI  Physical Exam Triage Vital Signs ED Triage Vitals  Enc Vitals Group     BP 07/14/22 1351 (!) 174/92     Pulse Rate 07/14/22 1351 78     Resp 07/14/22 1351 16     Temp 07/14/22 1351 99 F (37.2 C)     Temp Source 07/14/22 1351 Oral     SpO2 07/14/22 1351 100 %     Weight --      Height --      Head Circumference --      Peak Flow --      Pain Score 07/14/22 1353 9     Pain Loc --  Pain Edu? --      Excl. in [place]? --    No data found.  Updated Vital Signs BP (!) 174/92 (BP Location: Right Arm)   Pulse 78   Temp 99 F (37.2 C) (Oral)   Resp 16   LMP 07/07/2022   SpO2 100%   Visual Acuity Right Eye Distance:   Left Eye Distance:   Bilateral Distance:    Right Eye Near:   Left Eye Near:    Bilateral Near:     Physical Exam Vitals and nursing note reviewed. Exam conducted with a chaperone present.  Constitutional:      General: She is not in acute distress.    Appearance: Normal appearance. She is obese. She is not ill-appearing, toxic-appearing or diaphoretic.  HENT:     Head: Normocephalic and atraumatic.  Cardiovascular:     Rate and Rhythm: Normal rate.     Pulses: Normal pulses.     Heart sounds: No murmur heard.    No friction rub.  Pulmonary:     Effort: Pulmonary effort is normal. No respiratory distress.     Breath sounds: Normal breath sounds. No stridor. No wheezing or rhonchi.  Musculoskeletal:        General: Swelling and tenderness present. Normal range of  motion.     Right lower leg: Edema present.     Left lower leg: Edema present.     Comments: Bilateral edema, L>>>R Positive homan sign   Skin:    General: Skin is warm and dry.     Capillary Refill: Capillary refill takes less than 2 seconds.     Findings: Erythema (entire L lower extremity erythematous, edematous and warm to touch. Does NOT extend into ankle or foot) present. No rash.  Neurological:     General: No focal deficit present.     Mental Status: She is alert and oriented to person, place, and time.      UC Treatments / Results  Labs (all labs ordered are listed, but only abnormal results are displayed) Labs Reviewed - No data to display  EKG   Radiology US Venous Img Lower Unilateral Left (DVT)  Result Date: 07/14/2022 CLINICAL DATA:  [id number] Left leg swelling [id number]. Pain edema. Varicose veins/spider veins. EXAM: Left LOWER EXTREMITY VENOUS DOPPLER ULTRASOUND TECHNIQUE: Gray-scale sonography with compression, as well as color and duplex ultrasound, were performed to evaluate the deep venous system(s) from the level of the common femoral vein through the popliteal and proximal calf veins. COMPARISON:  CT lower extremity 08/02/2002 report without imaging FINDINGS: VENOUS Normal compressibility of the common femoral, superficial femoral, and popliteal veins. Limited visualization of the posterior tibial vein and peroneal vein. Chronic wall thickening of the small saphenous vein mid calf. Visualized portions of profunda femoral vein and great saphenous vein unremarkable. No filling defects to suggest DVT on grayscale or color Doppler imaging. Doppler waveforms show normal direction of venous flow, normal respiratory plasticity and response to augmentation. Limited views of the contralateral common femoral vein are unremarkable. OTHER None. Limitations: none IMPRESSION: 1. No deep venous thrombosis the left lower extremity with limited visualization of the calf veins. 2. Chronic  wall thickening of the small saphenous vein mid calf. Electronically Signed   By: Iven Finn M.D.   On: 07/14/2022 15:37    Procedures Procedures (including critical care time)  Medications Ordered in UC Medications - No data to display  Initial Impression / Assessment and Plan / UC Course  I have  reviewed the triage vital signs and the nursing notes.  Pertinent labs & imaging results that were available during my care of the patient were reviewed by me and considered in my medical decision making (see chart for details).     Cellulitis of L lower extremity -patient's symptoms consistent with cellulitis.  Will start Keflex 4 times daily.  Continue compression hose.  Doppler ultrasound performed in office which was negative for DVT.  Discussed signs symptoms that may warrant ER follow-up. Left leg swelling -as above.  Patient does have chronic bilateral edema.  Recommended she further discuss treatment options with her PCP, consider Lasix.   Final Clinical Impressions(s) / UC Diagnoses   Final diagnoses:  Left leg swelling  Cellulitis of left lower extremity without foot     Discharge Instructions      Your Doppler ultrasound is negative for DVT. Please start taking Keflex 4 times daily until gone. Continue wearing your compression hose, elevate your legs above the level of your heart when seated. Please schedule follow-up in 1 week with your primary care physician to ensure the infection is completely resolved. Also discussed the possible use of Lasix to help with your chronic edema.  If you develop a fever, nausea vomiting, abdominal discomfort, or lethargy, please had to the emergency room.     ED Prescriptions     Medication Sig Dispense Auth. Provider   cephALEXin (KEFLEX) 500 MG capsule Take 1 capsule (500 mg total) by mouth 4 (four) times daily for 7 days. 28 capsule Karsyn Jamie L, PA      PDMP not reviewed this encounter.   Maretta Bees,  [place] 07/14/22 2014

## 2022-07-14 NOTE — ED Triage Notes (Signed)
Pt c/o left leg swelling, warmth and pain for 2 days but getting worse. Has never been told she had a blood clot but states she has had swelling in legs in past.
